# Patient Record
Sex: Female | Born: 1995 | Race: Black or African American | Hispanic: No | Marital: Single | Smoking: Never smoker
Health system: Southern US, Community
[De-identification: ages and names within clinical notes are randomized; demographics above are authoritative.]

## PROBLEM LIST (undated history)

## (undated) HISTORY — PX: OTHER SURGICAL HISTORY: SHX169

---
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
## 2017-09-22 ENCOUNTER — Encounter: Payer: Self-pay | Admitting: Emergency Medicine

## 2017-09-22 ENCOUNTER — Emergency Department: Payer: Medicaid Other

## 2017-09-22 ENCOUNTER — Emergency Department
Admission: EM | Admit: 2017-09-22 | Discharge: 2017-09-22 | Disposition: A | Discharge status: Home / Self Care | Payer: Medicaid Other | Attending: Emergency Medicine | Admitting: Emergency Medicine

## 2017-09-22 DIAGNOSIS — R0789 Other chest pain: Secondary | ICD-10-CM | POA: Insufficient documentation

## 2017-09-22 MED ORDER — TRAMADOL HCL 50 MG PO TABS: 50.0000 mg | ORAL_TABLET | Freq: Two times a day (BID) | ORAL | 0 refills | Status: DC | PRN

## 2017-09-22 MED ORDER — NAPROXEN 500 MG PO TABS
500.0000 mg | ORAL_TABLET | Freq: Once | ORAL | Status: AC
  Administered 2017-09-22: 500 mg via ORAL
  Filled 2017-09-22: qty 1

## 2017-09-22 MED ORDER — TRAMADOL HCL 50 MG PO TABS
50.0000 mg | ORAL_TABLET | Freq: Once | ORAL | Status: AC
  Administered 2017-09-22: 50 mg via ORAL
  Filled 2017-09-22: qty 1

## 2017-09-22 MED ORDER — NAPROXEN 500 MG PO TABS: 500.0000 mg | ORAL_TABLET | Freq: Two times a day (BID) | ORAL | 0 refills | Status: DC

## 2017-09-22 NOTE — ED Provider Notes (Signed)
[facility] Emergency Department Provider Note   ____________________________________________   First MD Initiated Contact with Patient 09/22/17 1148     (approximate)  I have reviewed the triage vital signs and the nursing notes.   HISTORY  Chief Complaint Chest Pain    HPI Olivia Huynh is a 22 y.o. female patient complain of center upper chest wall pain secondary to a twisting bending incident.  Patient states she was in the shower when her creatinine fell and she twisted to the right and bent over to pick it up from the sting.  Patient that she felt a sharp pain in her chest.  Patient denies shortness of breath.  Patient states the pain does not radiate from the center of her chest.  Patient rates the pain as a 5/10.  Patient described the pain as "sore".  No palliative measured for complaint.  History reviewed. No pertinent past medical history.  There are no active problems to display for this patient.   History reviewed. No pertinent surgical history.  Prior to Admission medications   Medication Sig Start Date End Date Taking? Authorizing Provider  naproxen (NAPROSYN) 500 MG tablet Take 1 tablet (500 mg total) by mouth 2 (two) times daily with a meal. 09/22/17   Joni Reining, PA-C  traMADol (ULTRAM) 50 MG tablet Take 1 tablet (50 mg total) by mouth every 12 (twelve) hours as needed. 09/22/17   Joni Reining, PA-C    Allergies Penicillins  No family history on file.  Social History Social History   Tobacco Use  . Smoking status: Never Smoker  . Smokeless tobacco: Never Used  Substance Use Topics  . Alcohol use: No    Frequency: Never  . Drug use: No    Review of Systems Constitutional: No fever/chills Eyes: No visual changes. ENT: No sore throat. Cardiovascular: Denies chest pain. Respiratory: Denies shortness of breath. Gastrointestinal: No abdominal pain.  No nausea, no vomiting.  No diarrhea.  No  constipation. Genitourinary: Negative for dysuria. Musculoskeletal: Anterior upper chest wall pain Skin: Negative for rash. Neurological: Negative for headaches, focal weakness or numbness. Allergic/Immunilogical: Penicillin ____________________________________________   PHYSICAL EXAM:  VITAL SIGNS: ED Triage Vitals  Enc Vitals Group     BP 09/22/17 1136 131/71     Pulse Rate 09/22/17 1130 73     Resp 09/22/17 1130 16     Temp 09/22/17 1130 98.7 F (37.1 C)     Temp Source 09/22/17 1130 Oral     SpO2 09/22/17 1130 100 %     Weight 09/22/17 1130 278 lb (126.1 kg)     Height 09/22/17 1130 5\' 4"  (1.626 m)     Head Circumference --      Peak Flow --      Pain Score 09/22/17 1130 5     Pain Loc --      Pain Edu? --      Excl. in GC? --    Constitutional: Alert and oriented. Well appearing and in no acute distress.  Morbid obesity Neck: No stridor.   Cardiovascular: Normal rate, regular rhythm. Grossly normal heart sounds.  Good peripheral circulation. Respiratory: Normal respiratory effort.  No retractions. Lungs CTAB. Musculoskeletal: No chest wall deformity.  No abrasion or ecchymosis.  Patient is moderate guarding with palpation central chest wall.  Patient has full and equal chest wall expansion with respiration. Neurologic:  Normal speech and language. No gross focal neurologic deficits are appreciated. No gait instability. Skin:  Skin is warm, dry and intact. No rash noted. Psychiatric: Mood and affect are normal. Speech and behavior are normal.  ____________________________________________   LABS (all labs ordered are listed, but only abnormal results are displayed)  Labs Reviewed  POC URINE PREG, ED   ____________________________________________  EKG Read by heart station Dr.  ____________________________________________  RADIOLOGY  Dg Chest 2 View  Result Date: 09/22/2017 CLINICAL DATA:  Left-sided chest pain and shortness of breath. Dizziness for 2  months. Symptoms are worse today. EXAM: CHEST  2 VIEW COMPARISON:  None. FINDINGS: The heart size and mediastinal contours are within normal limits. Both lungs are clear. The visualized skeletal structures are unremarkable. IMPRESSION: Negative two view chest x-ray Electronically Signed   By: Marin Robertshristopher  Mattern M.D.   On: 09/22/2017 12:00    ____________________________________________   PROCEDURES  Procedure(s) performed:   Procedures  Critical Care performed: No  ____________________________________________   INITIAL IMPRESSION / ASSESSMENT AND PLAN / ED COURSE  As part of my medical decision making, I reviewed the following data within the electronic MEDICAL RECORD NUMBER    Chest wall pain secondary to inflammation.  Discuss EKG and chest x-ray findings with patient.  Patient given discharge care instructions and advised take medication as needed.  Patient advised follow-up PCP if condition persists.      ____________________________________________   FINAL CLINICAL IMPRESSION(S) / ED DIAGNOSES  Final diagnoses:  Chest wall pain     ED Discharge Orders        Ordered    naproxen (NAPROSYN) 500 MG tablet  2 times daily with meals     09/22/17 1224    traMADol (ULTRAM) 50 MG tablet  Every 12 hours PRN     09/22/17 1224       Note:  This document was prepared using Dragon voice recognition software and may include unintentional dictation errors.    Joni ReiningSmith, Latayna Ritchie K, PA-C 09/22/17 1231    Rockne MenghiniNorman, Anne-Caroline, MD 09/22/17 [phone]

## 2017-09-22 NOTE — ED Notes (Signed)
See triage note  Presents with some discomfort in mid chest after picking a curtain rod  resp even and non labored

## 2017-09-22 NOTE — ED Triage Notes (Signed)
Pt to ED via POV with c/o CP this am that started after she picked up a shower rod. Pt denies any injury to chest with object. Pt ambulatory, A&OX4, VS stable

## 2019-02-16 IMAGING — CR DG CHEST 2V
1 series · 2 of 2 positions shown · non-contrast
Comparison: None.

CLINICAL DATA: Left-sided chest pain and shortness of breath.
Dizziness for 2 months. Symptoms are worse today.

EXAM:
CHEST  2 VIEW

[Series 1: w chest pa · 0.14mm/px · 2 of 2 slices shown]
[im 1/2]
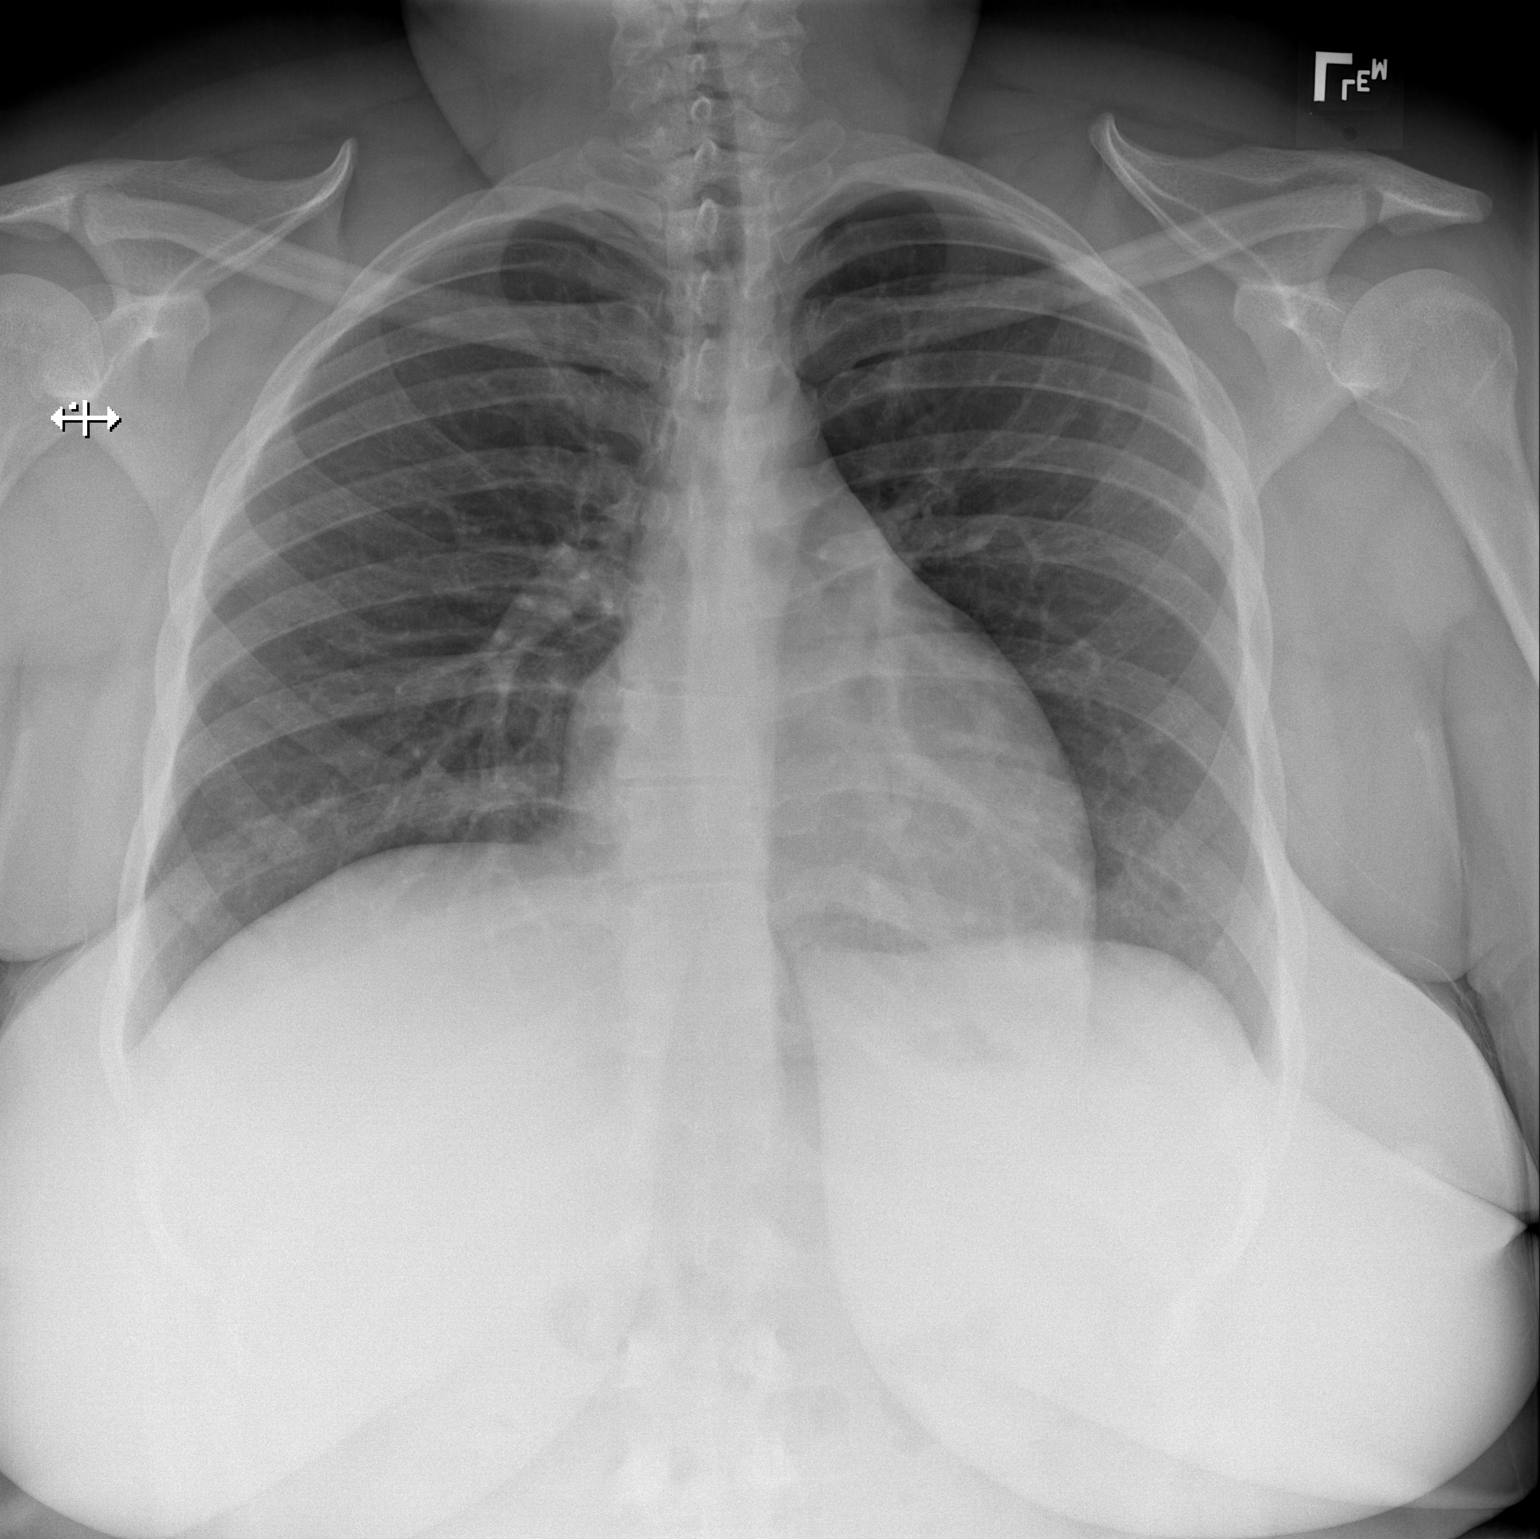
[im 2/2]
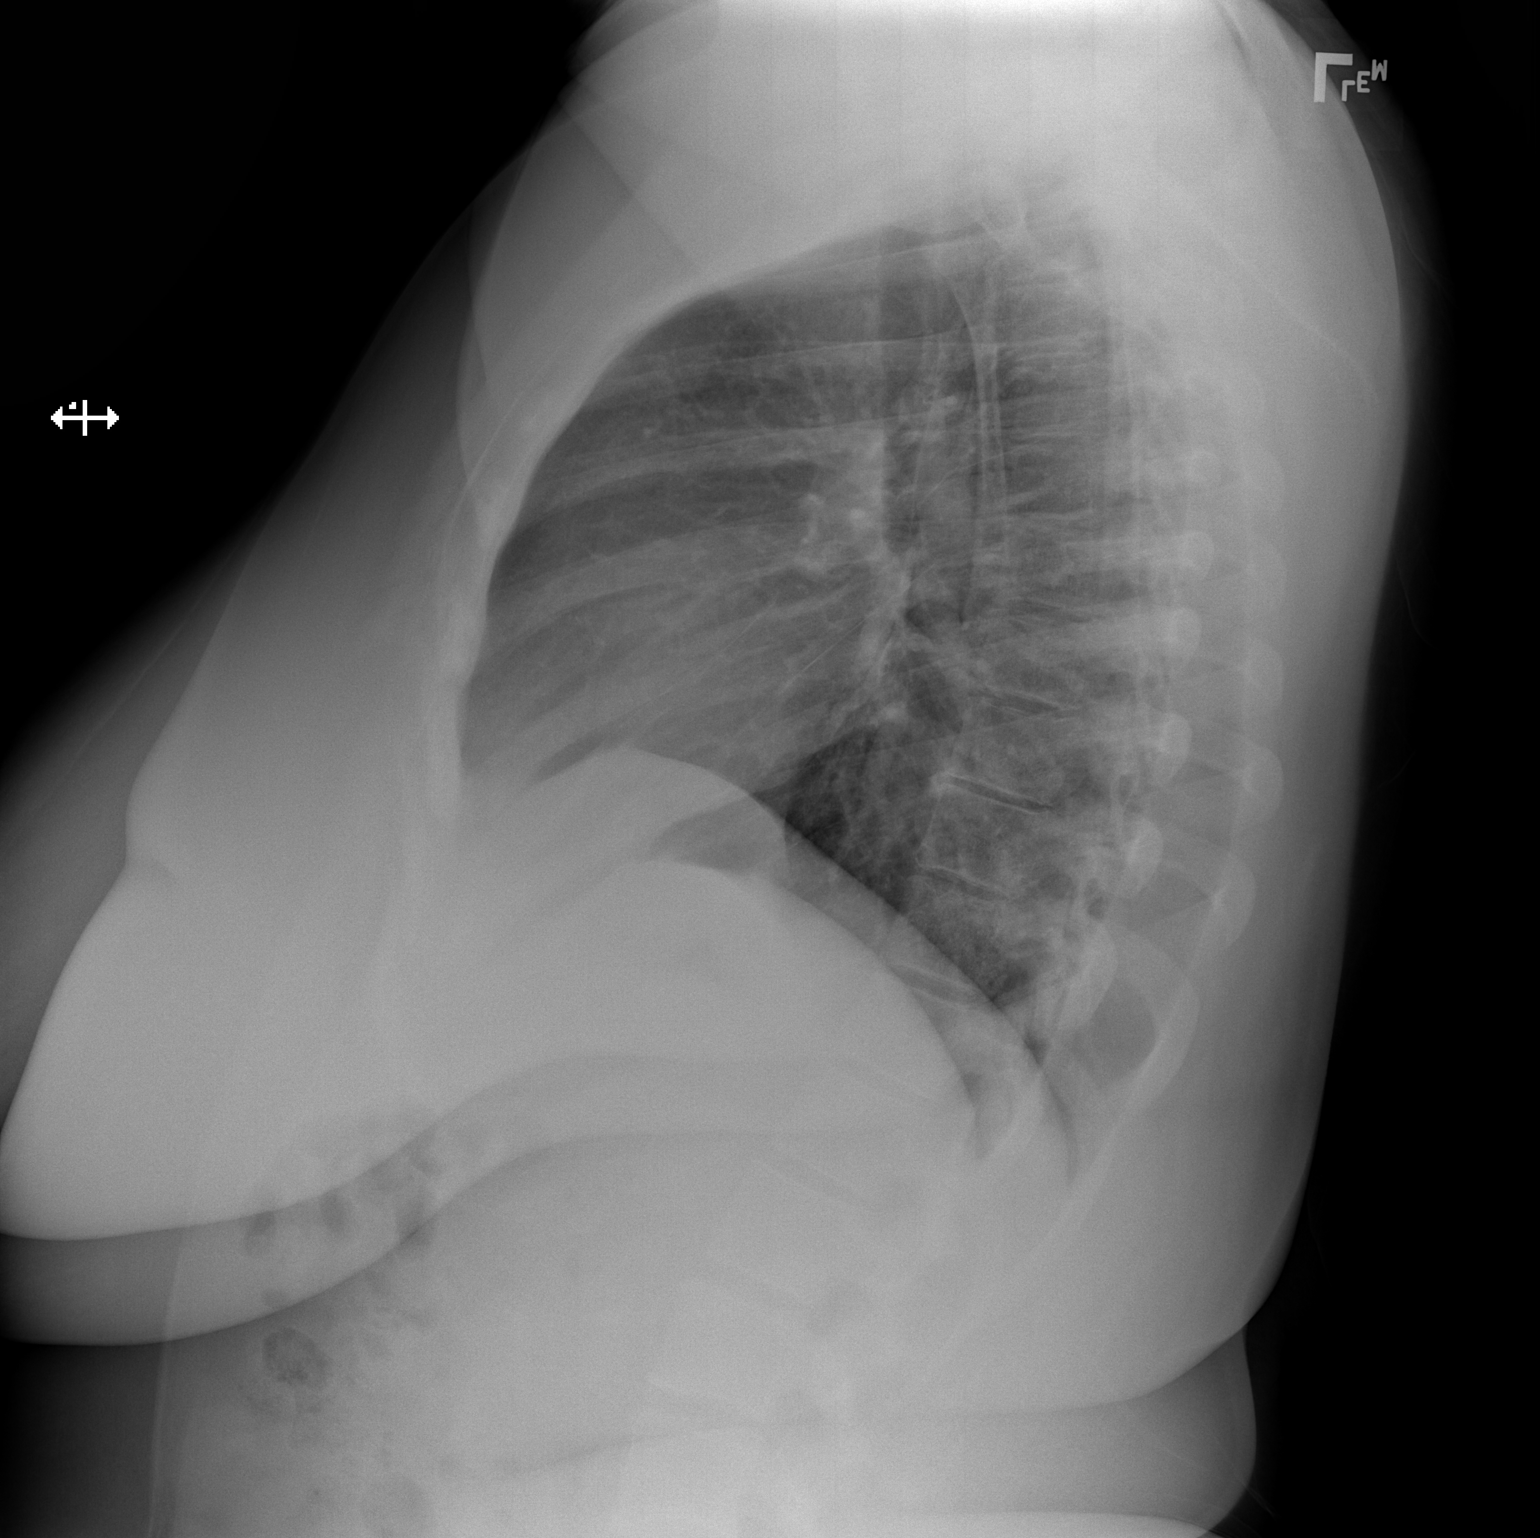

[2 of 2 positions shown; findings below may reference images not displayed]

FINDINGS: The heart size and mediastinal contours are within normal limits.
Both lungs are clear. The visualized skeletal structures are
unremarkable.
IMPRESSION: Negative two view chest x-ray

## 2023-07-24 ENCOUNTER — Emergency Department
Admission: EM | Admit: 2023-07-24 | Discharge: 2023-07-24 | Disposition: A | Discharge status: Home / Self Care | Payer: Medicaid Other | Attending: Emergency Medicine | Admitting: Emergency Medicine

## 2023-07-24 ENCOUNTER — Emergency Department: Payer: Medicaid Other

## 2023-07-24 DIAGNOSIS — R079 Chest pain, unspecified: Secondary | ICD-10-CM | POA: Diagnosis present

## 2023-07-24 DIAGNOSIS — M940 Chondrocostal junction syndrome [Tietze]: Secondary | ICD-10-CM | POA: Diagnosis not present

## 2023-07-24 LAB — BASIC METABOLIC PANEL
Anion gap: 10 (ref 5–15)
BUN: 8 mg/dL (ref 6–20)
CO2: 24 mmol/L (ref 22–32)
Calcium: 8.6 mg/dL — ABNORMAL LOW (ref 8.9–10.3)
Chloride: 103 mmol/L (ref 98–111)
Creatinine, Ser: 0.63 mg/dL (ref 0.44–1.00)
GFR, Estimated: >60 mL/min (ref >60)
Glucose, Bld: 93 mg/dL (ref 70–99)
Potassium: 3.5 mmol/L (ref 3.5–5.1)
Sodium: 137 mmol/L (ref 135–145)

## 2023-07-24 LAB — CBC
HCT: 35.8 % — ABNORMAL LOW (ref 36.0–46.0)
Hemoglobin: 11.6 g/dL — ABNORMAL LOW (ref 12.0–15.0)
MCH: 27.6 pg (ref 26.0–34.0)
MCHC: 32.4 g/dL (ref 30.0–36.0)
MCV: 85.2 fL (ref 80.0–100.0)
Platelets: 334 10*3/uL (ref 150–400)
RBC: 4.2 MIL/uL (ref 3.87–5.11)
RDW: 13.2 % (ref 11.5–15.5)
WBC: 8.4 10*3/uL (ref 4.0–10.5)
nRBC: 0 % (ref 0.0–0.2)

## 2023-07-24 LAB — TROPONIN I (HIGH SENSITIVITY): Troponin I (High Sensitivity): 2 ng/L (ref <18)

## 2023-07-24 MED ORDER — LIDOCAINE 5 % EX PTCH
1.0000 | MEDICATED_PATCH | CUTANEOUS | Status: DC
  Administered 2023-07-24: 1 via TRANSDERMAL
  Filled 2023-07-24: qty 1

## 2023-07-24 MED ORDER — LIDOCAINE 5 % EX PTCH: 1.0000 | MEDICATED_PATCH | Freq: Two times a day (BID) | CUTANEOUS | 0 refills | Status: AC

## 2023-07-24 MED ORDER — ACETAMINOPHEN 500 MG PO TABS
1000.0000 mg | ORAL_TABLET | Freq: Once | ORAL | Status: AC
  Administered 2023-07-24: 1000 mg via ORAL
  Filled 2023-07-24: qty 2

## 2023-07-24 MED ORDER — KETOROLAC TROMETHAMINE 30 MG/ML IJ SOLN
30.0000 mg | Freq: Once | INTRAMUSCULAR | Status: AC
  Administered 2023-07-24: 30 mg via INTRAMUSCULAR
  Filled 2023-07-24: qty 1

## 2023-07-24 MED ORDER — IBUPROFEN 600 MG PO TABS: 600.0000 mg | ORAL_TABLET | Freq: Four times a day (QID) | ORAL | 0 refills | Status: AC | PRN

## 2023-07-24 NOTE — ED Triage Notes (Signed)
Pt c/o L sided CP that started last night. Endorses SOB. Denies N/V, diaphoresis.

## 2023-07-24 NOTE — Discharge Instructions (Addendum)
Tylenol 1 g every 8 hours with the ibuprofen and a lidocaine patches.  Return to the ER if you develop shortness of breath that is worsening, swelling in 1 leg, coughing up blood or any other concerns

## 2023-07-24 NOTE — ED Provider Notes (Signed)
[facility] Provider Note    Event Date/Time   First MD Initiated Contact with Patient 07/24/23 2126     (approximate)   History   Chest Pain   HPI  Olivia Huynh is a 27 y.o. female who is otherwise healthy comes in with left-sided chest pain.  She reports pain started yesterday has been constant since yesterday.  She reports that occasionally when the pain is there she gets shortness of breath.  Pain is worse with movement.  Denies any shortness of breath at this point.  She reports history of costochondritis previously.  She denies any risk factors for PE.  Does report that it does hurt when she takes a deep breath but denies any fevers, coughing or other concerns.  She denies any smoking.  Physical Exam   Triage Vital Signs: ED Triage Vitals  Encounter Vitals Group     BP 07/24/23 1758 133/78     Systolic BP Percentile --      Diastolic BP Percentile --      Pulse Rate 07/24/23 1758 93     Resp 07/24/23 1758 17     Temp 07/24/23 1758 98.4 F (36.9 C)     Temp src --      SpO2 07/24/23 1758 98 %     Weight 07/24/23 1806 280 lb (127 kg)     Height 07/24/23 1806 5\' 4"  (1.626 m)     Head Circumference --      Peak Flow --      Pain Score 07/24/23 1806 8     Pain Loc --      Pain Education --      Exclude from Growth Chart --     Most recent vital signs: Vitals:   07/24/23 1758  BP: 133/78  Pulse: 93  Resp: 17  Temp: 98.4 F (36.9 C)  SpO2: 98%     General: Awake, no distress.  CV:  Good peripheral perfusion.  Resp:  Normal effort.  Abd:  No distention.  Other:  Some mild chest wall tenderness.  She does have some tenderness with twisting and movement No swelling in legs.  No calf tenderness  ED Results / Procedures / Treatments   Labs (all labs ordered are listed, but only abnormal results are displayed) Labs Reviewed  BASIC METABOLIC PANEL - Abnormal; Notable for the following components:      Result Value   Calcium 8.6  (*)    All other components within normal limits  CBC - Abnormal; Notable for the following components:   Hemoglobin 11.6 (*)    HCT 35.8 (*)    All other components within normal limits  POC URINE PREG, ED  TROPONIN I (HIGH SENSITIVITY)  TROPONIN I (HIGH SENSITIVITY)     EKG  My interpretation of EKG:  Normal sinus rate of 88 without any ST elevation or T wave inversions, normal intervals  RADIOLOGY I have reviewed the xray personally and interpreted no pneumonia  PROCEDURES:  Critical Care performed: No  Procedures   MEDICATIONS ORDERED IN ED: Medications - No data to display   IMPRESSION / MDM / ASSESSMENT AND PLAN / ED COURSE  I reviewed the triage vital signs and the nursing notes.   Patient's presentation is most consistent with acute presentation with potential threat to life or bodily function.   Differential is ACS, considered PE but patient is PERC negative.  Discussed with them D-dimer given her shortness of breath ebing pleuritic  but she denies any shortness of breath at this time and they would like to hold off.  I suspect that this is most likely musculoskeletal in nature.   BMP is reassuring.  CBC shows slightly low hemoglobin.  Troponin was negative.  Patient declined pregnancy testing and that she is not sexually active.  10:17 PM evaluated patient and she reports pain is gone away.  We discussed D-dimer again and she declined.  I think this is reasonable given she is PERC negative and pain is got better.  We discussed return precautions including shortness of breath that is worsening or any other concerns but at this time we will treat this like a costochondritis given it is worse with movement and with palpation.       FINAL CLINICAL IMPRESSION(S) / ED DIAGNOSES   Final diagnoses:  Costochondritis     Rx / DC Orders   ED Discharge Orders          Ordered    ibuprofen (ADVIL) 600 MG tablet  Every 6 hours PRN        07/24/23 2206     lidocaine (LIDODERM) 5 %  Every 12 hours        07/24/23 2206             Note:  This document was prepared using Dragon voice recognition software and may include unintentional dictation errors.   Concha Se, MD 07/24/23 2218

## 2024-01-14 ENCOUNTER — Encounter: Payer: Self-pay | Admitting: Student

## 2024-01-14 ENCOUNTER — Ambulatory Visit (INDEPENDENT_AMBULATORY_CARE_PROVIDER_SITE_OTHER): Admitting: Student

## 2024-01-14 VITALS — BP 118/74 | HR 70 | Ht 64.0 in | Wt 291.4 lb

## 2024-01-14 DIAGNOSIS — N926 Irregular menstruation, unspecified: Secondary | ICD-10-CM | POA: Insufficient documentation

## 2024-01-14 DIAGNOSIS — Z0000 Encounter for general adult medical examination without abnormal findings: Secondary | ICD-10-CM | POA: Insufficient documentation

## 2024-01-14 DIAGNOSIS — E66813 Obesity, class 3: Secondary | ICD-10-CM | POA: Insufficient documentation

## 2024-01-14 DIAGNOSIS — Z23 Encounter for immunization: Secondary | ICD-10-CM | POA: Diagnosis not present

## 2024-01-14 DIAGNOSIS — H18603 Keratoconus, unspecified, bilateral: Secondary | ICD-10-CM | POA: Diagnosis not present

## 2024-01-14 DIAGNOSIS — Z1159 Encounter for screening for other viral diseases: Secondary | ICD-10-CM | POA: Diagnosis not present

## 2024-01-14 DIAGNOSIS — Z6841 Body Mass Index (BMI) 40.0 and over, adult: Secondary | ICD-10-CM | POA: Insufficient documentation

## 2024-01-14 DIAGNOSIS — D509 Iron deficiency anemia, unspecified: Secondary | ICD-10-CM | POA: Insufficient documentation

## 2024-01-14 DIAGNOSIS — D500 Iron deficiency anemia secondary to blood loss (chronic): Secondary | ICD-10-CM | POA: Diagnosis not present

## 2024-01-14 DIAGNOSIS — R7303 Prediabetes: Secondary | ICD-10-CM | POA: Insufficient documentation

## 2024-01-14 DIAGNOSIS — H18609 Keratoconus, unspecified, unspecified eye: Secondary | ICD-10-CM | POA: Insufficient documentation

## 2024-01-14 NOTE — Assessment & Plan Note (Signed)
 S/p cross linking, still having blurred vision and issues with contacts. Has appointment next week [facility]

## 2024-01-14 NOTE — Assessment & Plan Note (Signed)
Referral made to OB/GYN 

## 2024-01-14 NOTE — Assessment & Plan Note (Signed)
 Likely due to prolong and irregular menses. Mild anemia on labs in 07/2023 with Hgb of 11.6. Check cbc, ferritin, and iron panel today.

## 2024-01-14 NOTE — Assessment & Plan Note (Signed)
 Reports history of prediabetes. Denies polydipsia or polyuria. Repeat A1c today.

## 2024-01-14 NOTE — Assessment & Plan Note (Addendum)
 BMI is 50.01 today. Discussed diet and exercise modifications. She made goal to get a new gym membership and go to the gym at least 1 time a week. She will make grocery list for mom on Fridays and limit eating out to 2 times a month. Discussed mediterranean diet and my plate. She declined nutrition therapy.

## 2024-01-14 NOTE — Progress Notes (Signed)
 New Patient Office Visit  Subjective    Patient ID: Olivia Huynh, female    DOB: [birth year]  Age: 28 y.o. MRN: [id number]  CC:  Chief Complaint  Patient presents with   Establish Care    wants to establish care, aged out of  peds, didn't go to anyone til now   Menstrual Problem    Irregular periods, lasting 1 month long    HPI Olivia Huynh [age] year old person with iron deficiency anemia, prediabetes, and keratoconus presents to establish care. [employer] student [employer], [employer], and robotics. Lives with mother and siblings and aunt. Does not have drivers license. Previously seeing pediatrics in [place]. Patient interviewed with mother present.   History of iron deficiency due to heavy periods, this has been an issue for about 3 years. Reports prolonged periods lasting 1-2 months, spotting and cramping for the first 3 days, then flow is heavier passing 3-4 clots daily. Does not have insignificant pain. LMP 3/17-4/19. She was prescribed iron by pediatrics, and now taking over the counter supplement daily.   Age of menarche 11-12 years, typically had periods lasting 1 week but this has gradually become longer and more irregular. She is not currently sexually active. Has never used any form of birth control. Denies abdominal pain, n/v/d, vaginal discharge, rash, or pruritus.  Obesity Reports weight fluctuates between 285-295 lbs. Used to go to gym regularly but stopped. Now sedentary although walks around campus between classes. Reports she eats a lot of fast food and [employer]. Has difficulty stick to diet and routine. Mom reports family has had nutrition counseling in the past but patient has never stuck to diet.    Outpatient Encounter Medications as of 01/14/2024  Medication Sig   Iron-Vitamin C 65-125 MG TABS Take 125 mg by mouth daily.   [DISCONTINUED] Ferrous Sulfate Dried (FERROUS SULFATE CR PO) Take by mouth. OTC   [DISCONTINUED] naproxen  (NAPROSYN ) 500 MG  tablet Take 1 tablet (500 mg total) by mouth 2 (two) times daily with a meal. (Patient not taking: Reported on 01/14/2024)   [DISCONTINUED] traMADol  (ULTRAM ) 50 MG tablet Take 1 tablet (50 mg total) by mouth every 12 (twelve) hours as needed. (Patient not taking: Reported on 01/14/2024)   No facility-administered encounter medications on file as of 01/14/2024.    History reviewed. No pertinent past medical history.  Past Surgical History:  Procedure Laterality Date   corneal crosslinking      History reviewed. No pertinent family history.  Social History   Socioeconomic History   Marital status: Single    Spouse name: Not on file   Number of children: Not on file   Years of education: Not on file   Highest education level: Not on file  Occupational History   Not on file  Tobacco Use   Smoking status: Never   Smokeless tobacco: Never  Substance and Sexual Activity   Alcohol use: No   Drug use: No   Sexual activity: Not on file  Other Topics Concern   Not on file  Social History Narrative   Not on file   Social Drivers of Health   Financial Resource Strain: Low Risk  (08/20/2023)   Received from [facility]   Overall Financial Resource Strain (CARDIA)    Difficulty of Paying Living Expenses: Not hard at all  Food Insecurity: No Food Insecurity (08/20/2023)   Received from [facility]   Hunger Vital Sign    Worried About Running Out  of Food in the Last Year: Never true    Ran Out of Food in the Last Year: Never true  Transportation Needs: No Transportation Needs (08/20/2023)   Received from [facility] Transportation    In the past 12 months, has lack of transportation kept you from medical appointments or from getting medications?: No    Lack of Transportation (Non-Medical): No  Physical Activity: Not on file  Stress: Not on file  Social Connections: Not on file  Intimate Partner Violence: Not on file     ROS Refer to HPI    Objective   BP 118/74   Pulse 70   Ht 5\' 4"  (1.626 m)   Wt 291 lb 6 oz (132.2 kg)   LMP 11/15/2023   SpO2 99%   BMI 50.01 kg/m   Physical Exam Constitutional:      Appearance: She is obese.  Eyes:     General: No scleral icterus.    Extraocular Movements: Extraocular movements intact.     Conjunctiva/sclera: Conjunctivae normal.     Pupils: Pupils are equal, round, and reactive to light.  Cardiovascular:     Rate and Rhythm: Normal rate and regular rhythm.     Pulses: Normal pulses.     Heart sounds: No murmur heard. Pulmonary:     Effort: Pulmonary effort is normal.     Breath sounds: No rhonchi or rales.  Abdominal:     General: Abdomen is flat. Bowel sounds are normal. There is no distension.     Palpations: Abdomen is soft.     Tenderness: There is no abdominal tenderness.  Musculoskeletal:        General: Normal range of motion.     Right lower leg: No edema.     Left lower leg: No edema.  Skin:    General: Skin is warm and dry.     Capillary Refill: Capillary refill takes less than 2 seconds.     Coloration: Skin is not jaundiced or pale.  Neurological:     General: No focal deficit present.     Mental Status: She is alert and oriented to person, place, and time.  Psychiatric:        Mood and Affect: Mood normal.        Behavior: Behavior normal.        01/14/2024   10:00 AM  Depression screen PHQ 2/9  Decreased Interest 0  Down, Depressed, Hopeless 0  PHQ - 2 Score 0  Altered sleeping 0  Tired, decreased energy 0  Change in appetite 0  Feeling bad or failure about yourself  0  Trouble concentrating 0  Moving slowly or fidgety/restless 0  Suicidal thoughts 0  PHQ-9 Score 0  Difficult doing work/chores Not difficult at all      01/14/2024   10:00 AM  GAD 7 : Generalized Anxiety Score  Nervous, Anxious, on Edge 0  Control/stop worrying 0  Worry too much - different things 0  Trouble relaxing 0  Restless 0  Easily  annoyed or irritable 1  Afraid - awful might happen 0  Total GAD 7 Score 1  Anxiety Difficulty Not difficult at all    Last CBC Lab Results  Component Value Date   WBC 8.4 07/24/2023   HGB 11.6 (L) 07/24/2023   HCT 35.8 (L) 07/24/2023   MCV 85.2 07/24/2023   MCH 27.6 07/24/2023   RDW 13.2 07/24/2023   PLT 334 07/24/2023  Last metabolic panel Lab Results  Component Value Date   GLUCOSE 93 07/24/2023   NA 137 07/24/2023   K 3.5 07/24/2023   CL 103 07/24/2023   CO2 24 07/24/2023   BUN 8 07/24/2023   CREATININE 0.63 07/24/2023   GFRNONAA >60 07/24/2023   CALCIUM 8.6 (L) 07/24/2023   ANIONGAP 10 07/24/2023    Assessment & Plan:  Annual physical exam Assessment & Plan: Tdap ordered today She is up to date on HPV Referral to GYN for menstrual irregularities, deferred pap today HIV and hepatitis C screening  Orders: -     Hemoglobin A1c -     Lipid panel -     Comprehensive metabolic panel with GFR -     TSH  Keratoconus of both eyes Assessment & Plan: S/p cross linking, still having blurred vision and issues with contacts. Has appointment next week [place] [facility]   Iron deficiency anemia due to chronic blood loss Assessment & Plan: Likely due to prolong and irregular menses. Mild anemia on labs in 07/2023 with Hgb of 11.6. Check cbc, ferritin, and iron panel today.   Orders: -     CBC -     Iron, TIBC and Ferritin Panel -     Ambulatory referral to Obstetrics / Gynecology  Screening for viral disease -     HIV Antibody (routine testing w rflx) -     Hepatitis C antibody  Irregular menstruation -     Ambulatory referral to Obstetrics / Gynecology  Immunization due -     Tdap vaccine greater than or equal to 7yo IM  Obesity, class 3 Assessment & Plan: BMI is 50.01 today. Discussed diet and exercise modifications. She made goal to get a new gym membership and go to the gym at least 1 time a week. She will make grocery list for mom on Fridays and limit  eating out to 2 times a month. Discussed mediterranean diet and my plate. She declined nutrition therapy.    BMI 50.0-59.9, adult [facility])  Prediabetes Assessment & Plan: Reports history of prediabetes. Denies polydipsia or polyuria. Repeat A1c today.     Return in about 3 months (around 04/15/2024) for weight.   Barnetta Liberty, MD

## 2024-01-14 NOTE — Addendum Note (Signed)
 Addended by: Barnetta Liberty on: 01/14/2024 03:27 PM   Modules accepted: Level of Service

## 2024-01-14 NOTE — Assessment & Plan Note (Addendum)
 Tdap ordered today She is up to date on HPV Referral to GYN for menstrual irregularities, deferred pap today HIV and hepatitis C screening

## 2024-01-15 LAB — IRON,TIBC AND FERRITIN PANEL
Ferritin: 29 ng/mL (ref 15–150)
Iron Saturation: 15 % (ref 15–55)
Iron: 54 ug/dL (ref 27–159)
Total Iron Binding Capacity: 372 ug/dL (ref 250–450)
UIBC: 318 ug/dL (ref 131–425)

## 2024-01-15 LAB — COMPREHENSIVE METABOLIC PANEL WITH GFR
ALT: 11 IU/L (ref 0–32)
AST: 16 IU/L (ref 0–40)
Albumin: 4.5 g/dL (ref 4.0–5.0)
Alkaline Phosphatase: 84 IU/L (ref 44–121)
BUN/Creatinine Ratio: 16 (ref 9–23)
BUN: 11 mg/dL (ref 6–20)
Bilirubin Total: 0.2 mg/dL (ref 0.0–1.2)
CO2: 22 mmol/L (ref 20–29)
Calcium: 9.3 mg/dL (ref 8.7–10.2)
Chloride: 102 mmol/L (ref 96–106)
Creatinine, Ser: 0.67 mg/dL (ref 0.57–1.00)
Globulin, Total: 3.2 g/dL (ref 1.5–4.5)
Glucose: 71 mg/dL (ref 70–99)
Potassium: 4.5 mmol/L (ref 3.5–5.2)
Sodium: 140 mmol/L (ref 134–144)
Total Protein: 7.7 g/dL (ref 6.0–8.5)
eGFR: 124 mL/min/{1.73_m2} (ref >59)

## 2024-01-15 LAB — CBC
Hematocrit: 34.3 % (ref 34.0–46.6)
Hemoglobin: 10.8 g/dL — ABNORMAL LOW (ref 11.1–15.9)
MCH: 28.1 pg (ref 26.6–33.0)
MCHC: 31.5 g/dL (ref 31.5–35.7)
MCV: 89 fL (ref 79–97)
Platelets: 360 10*3/uL (ref 150–450)
RBC: 3.85 x10E6/uL (ref 3.77–5.28)
RDW: 12.7 % (ref 11.7–15.4)
WBC: 8.2 10*3/uL (ref 3.4–10.8)

## 2024-01-15 LAB — LIPID PANEL
Chol/HDL Ratio: 2.2 ratio (ref 0.0–4.4)
Cholesterol, Total: 153 mg/dL (ref 100–199)
HDL: 71 mg/dL (ref >39)
LDL Chol Calc (NIH): 72 mg/dL (ref 0–99)
Triglycerides: 43 mg/dL (ref 0–149)
VLDL Cholesterol Cal: 10 mg/dL (ref 5–40)

## 2024-01-15 LAB — HEPATITIS C ANTIBODY: Hep C Virus Ab: NONREACTIVE

## 2024-01-15 LAB — HEMOGLOBIN A1C
Est. average glucose Bld gHb Est-mCnc: 97 mg/dL
Hgb A1c MFr Bld: 5 % (ref 4.8–5.6)

## 2024-01-15 LAB — TSH: TSH: 1.76 u[IU]/mL (ref 0.450–4.500)

## 2024-01-15 LAB — HIV ANTIBODY (ROUTINE TESTING W REFLEX): HIV Screen 4th Generation wRfx: NONREACTIVE

## 2024-01-20 ENCOUNTER — Ambulatory Visit: Payer: Self-pay | Admitting: Student

## 2024-01-20 NOTE — Telephone Encounter (Signed)
Please review patients response.

## 2024-01-21 MED ORDER — IRON (FERROUS SULFATE) 325 (65 FE) MG PO TABS: 325.0000 mg | ORAL_TABLET | Freq: Every day | ORAL | 3 refills | Status: AC

## 2024-01-21 NOTE — Telephone Encounter (Signed)
 Please review.  KP

## 2024-02-02 ENCOUNTER — Encounter: Payer: Self-pay | Admitting: Advanced Practice Midwife

## 2024-02-02 ENCOUNTER — Ambulatory Visit (INDEPENDENT_AMBULATORY_CARE_PROVIDER_SITE_OTHER): Admitting: Advanced Practice Midwife

## 2024-02-02 VITALS — BP 114/79 | HR 70 | Ht 64.0 in | Wt 294.5 lb

## 2024-02-02 DIAGNOSIS — N921 Excessive and frequent menstruation with irregular cycle: Secondary | ICD-10-CM

## 2024-02-02 MED ORDER — NORGESTIMATE-ETH ESTRADIOL 0.25-35 MG-MCG PO TABS: 1.0000 | ORAL_TABLET | Freq: Every day | ORAL | 11 refills | Status: AC

## 2024-02-02 NOTE — Progress Notes (Signed)
 Patient ID: Izela Altier, female   DOB: [birth year], 28 y.o.   MRN: [id number]  Reason for Consult: Menstrual Problem (Irregular periods lasting 1-2 months at a time)   Referred by Barnetta Liberty, MD  Subjective:  HPI:  Lorece Keach is a 28 y.o. female being seen by referral for irregular menstrual cycles. She is uncertain how long cycles have been irregular- possibly the last 8 years as she remembers telling this to the pediatrician. Menses occur every 1-3 months and can last for 2 weeks up to 2.5 months. Bleeding is heavy per her report changing a pad every 1-2 hours and includes clots. She reports monthly periods from age [age] lasting 7 days, less heavy than they are now.   She has never been sexually active, no concern for STDs, has never had a PAP smear, did receive Gardasil vaccinations. She has never taken hormones for birth/cycle control. Has been seen by PCP who is encouraging lifestyle changes- diet/exercise. Normal Hgb A1C at recent labs.  We discussed recommendation and rationale for PAP (cervical cancer screening) beginning at age 89. She is not ready mentally/emotionally for that today and will schedule at a later time. We discussed hormone function that can be affected by excess weight causing irregular periods and the possibility of PCOS that is associated with irregular cycles, obesity, infertility. Loss of 15% body weight can be associated with improved hormone function/fertility. She would like to have PCOS lab done today.  History reviewed. No pertinent past medical history. History reviewed. No pertinent family history. Past Surgical History:  Procedure Laterality Date   corneal crosslinking      Short Social History:  Social History   Tobacco Use   Smoking status: Never   Smokeless tobacco: Never  Substance Use Topics   Alcohol use: No    Allergies  Allergen Reactions   Nickel Hives   Penicillins     amoxocillin gives hives     Current Outpatient  Medications  Medication Sig Dispense Refill   Iron , Ferrous Sulfate , 325 (65 Fe) MG TABS Take 325 mg by mouth daily. 30 tablet 3   norgestimate-ethinyl estradiol (ORTHO-CYCLEN) 0.25-35 MG-MCG tablet Take 1 tablet by mouth daily. 30 tablet 11   No current facility-administered medications for this visit.    Review of Systems  Constitutional:  Negative for chills and fever.  HENT:  Negative for congestion, ear discharge, ear pain, hearing loss, sinus pain and sore throat.   Eyes:  Negative for blurred vision and double vision.  Respiratory:  Negative for cough, shortness of breath and wheezing.   Cardiovascular:  Negative for chest pain, palpitations and leg swelling.  Gastrointestinal:  Negative for abdominal pain, blood in stool, constipation, diarrhea, heartburn, melena, nausea and vomiting.  Genitourinary:  Negative for dysuria, flank pain, frequency, hematuria and urgency.  Musculoskeletal:  Negative for back pain, joint pain and myalgias.  Skin:  Negative for itching and rash.  Neurological:  Negative for dizziness, tingling, tremors, sensory change, speech change, focal weakness, seizures, loss of consciousness, weakness and headaches.  Endo/Heme/Allergies:  Negative for environmental allergies. Does not bruise/bleed easily.       Positive for irregular menstrual cycles  Psychiatric/Behavioral:  Negative for depression, hallucinations, memory loss, substance abuse and suicidal ideas. The patient is not nervous/anxious and does not have insomnia.        Objective:  Objective   Vitals:   02/02/24 0818  BP: 114/79  Pulse: 70  Weight: 294 lb 8 oz (133.6 kg)  Height: 5\' 4"  (1.626 m)   Body mass index is 50.55 kg/m. Constitutional: obese female in no acute distress.  HEENT: normal Skin: Warm and dry.  Cardiovascular: Regular rate and rhythm.   Respiratory:  Normal respiratory effort Psych: Alert and Oriented x3. No memory deficits. Normal mood and affect.      Assessment/Plan:     27 y.o. G0 P0 female with irregular, heavy/prolonged menstrual cycles  PCOS panel Healthy lifestyle; diet, exercise Rx Sprintec for cycle regulation Follow up as needed Schedule PAP smear when ready   Angelita Kendall, CNM [facility] 02/02/2024 9:51 AM

## 2024-02-02 NOTE — Patient Instructions (Signed)
 Why Obesity Matters Obesity is a condition common in both adults and children that can significantly impact overall health. You will learn how this medical condition develops, and the impact on your health. To view the content, go to this web address: [email or web address]  This video will expire on: 08/11/2025. If you need access to this video following this date, please reach out to the healthcare provider who assigned it to you. This information is not intended to replace advice given to you by your health care provider. Make sure you discuss any questions you have with your health care provider. Elsevier Patient Education  2024 Elsevier Inc.Obesity, Adult Obesity is having too much body fat. Being obese means that your weight is more than what is healthy for you.  BMI (body mass index) is a number that explains how much body fat you have. If you have a BMI of 30 or more, you are obese. Obesity can cause serious health problems, such as: Stroke. Coronary artery disease (CAD). Type 2 diabetes. Some types of cancer. High blood pressure (hypertension). High cholesterol. Gallbladder stones. Obesity can also contribute to: Osteoarthritis. Sleep apnea. Infertility problems. What are the causes? Eating meals each day that are high in calories, sugar, and fat. Drinking a lot of drinks that have sugar in them. Being born with genes that may make you more likely to become obese. Having a medical condition that causes obesity. Taking certain medicines. Sitting a lot (having a sedentary lifestyle). Not getting enough sleep. What increases the risk? Having a family history of obesity. Living in an area with limited access to: [place], recreation centers, or sidewalks. Healthy food choices, such as grocery stores and farmers' markets. What are the signs or symptoms? The main sign is having too much body fat. How is this treated? Treatment for this condition often includes  changing your lifestyle. Treatment may include: Changing your diet. This may include making a healthy meal plan. Exercise. This may include activity that causes your heart to beat faster (aerobic exercise) and strength training. Work with your doctor to design a program that works for you. Medicine to help you lose weight. This may be used if you are not able to lose one pound a week after 6 weeks of healthy eating and more exercise. Treating conditions that cause the obesity. Surgery. Options may include gastric banding and gastric bypass. This may be done if: Other treatments have not helped to improve your condition. You have a BMI of 40 or higher. You have life-threatening health problems related to obesity. Follow these instructions at home: Eating and drinking  Follow advice from your doctor about what to eat and drink. Your doctor may tell you to: Limit fast food, sweets, and processed snack foods. Choose low-fat options. For example, choose low-fat milk instead of whole milk. Eat five or more servings of fruits or vegetables each day. Eat at home more often. This gives you more control over what you eat. Choose healthy foods when you eat out. Learn to read food labels. This will help you learn how much food is in one serving. Keep low-fat snacks available. Avoid drinks that have a lot of sugar in them. These include soda, fruit juice, iced tea with sugar, and flavored milk. Drink enough water to keep your pee (urine) pale yellow. Do not go on fad diets. Physical activity Exercise often, as told by your doctor. Most adults should get up to 150 minutes of moderate-intensity exercise every week.Ask your doctor: What types of  exercise are safe for you. How often you should exercise. Warm up and stretch before being active. Do slow stretching after being active (cool down). Rest between times of being active. Lifestyle Work with your doctor and a food expert (dietitian) to set a  weight-loss goal that is best for you. Limit your screen time. Find ways to reward yourself that do not involve food. Do not drink alcohol if: Your doctor tells you not to drink. You are pregnant, may be pregnant, or are planning to become pregnant. If you drink alcohol: Limit how much you have to: 0-1 drink a day for women. 0-2 drinks a day for men. Know how much alcohol is in your drink. In the [place] one drink equals one 12 oz bottle of beer (355 mL), one 5 oz glass of wine (148 mL), or one 1 oz glass of hard liquor (44 mL). General instructions Keep a weight-loss journal. This can help you keep track of: The food that you eat. How much exercise you get. Take over-the-counter and prescription medicines only as told by your doctor. Take vitamins and supplements only as told by your doctor. Think about joining a support group. Pay attention to your mental health as obesity can lead to depression or self esteem issues. Keep all follow-up visits. Contact a doctor if: You cannot meet your weight-loss goal after you have changed your diet and lifestyle for 6 weeks. You are having trouble breathing. Summary Obesity is having too much body fat. Being obese means that your weight is more than what is healthy for you. Work with your doctor to set a weight-loss goal. Get regular exercise as told by your doctor. This information is not intended to replace advice given to you by your health care provider. Make sure you discuss any questions you have with your health care provider. Document Revised: 03/25/2021 Document Reviewed: 03/25/2021 Elsevier Patient Education  2024 Elsevier Inc.Mediterranean Diet A Mediterranean diet is based on the traditions of countries on the [employer]. It focuses on eating more: Fruits and vegetables. Whole grains, beans, nuts, and seeds. Heart-healthy fats. These are fats that are good for your heart. It involves eating less: Dairy. Meat and  eggs. Processed foods with added sugar, salt, and fat. This type of diet can help prevent certain conditions. It can also improve outcomes if you have a long-term (chronic) disease, such as kidney or heart disease. What are tips for following this plan? Reading food labels Check packaged foods for: The serving size. For foods such as rice and pasta, the serving size is the amount of cooked product, not dry. The total fat. Avoid foods with saturated fat or trans fat. Added sugars, such as corn syrup. Shopping  Try to have a balanced diet. Buy a variety of foods, such as: Fresh fruits and vegetables. You may be able to get these from local farmers markets. You can also buy them frozen. Grains, beans, nuts, and seeds. Some of these can be bought in bulk. Fresh seafood. Poultry and eggs. Low-fat dairy products. Buy whole ingredients instead of foods that have already been packaged. If you can't get fresh seafood, buy precooked frozen shrimp or canned fish, such as tuna, salmon, or sardines. Stock your pantry so you always have certain foods on hand, such as olive oil, canned tuna, canned tomatoes, rice, pasta, and beans. Cooking Cook foods with extra-virgin olive oil instead of using butter or other vegetable oils. Have meat as a side dish. Have vegetables or grains  as your main dish. This means having meat in small portions or adding small amounts of meat to foods like pasta or stew. Use beans or vegetables instead of meat in common dishes like chili or lasagna. Try out different cooking methods. Try roasting, broiling, steaming, and sauting vegetables. Add frozen vegetables to soups, stews, pasta, or rice. Add nuts or seeds for added healthy fats and plant protein at each meal. You can add these to yogurt, salads, or vegetable dishes. Marinate fish or vegetables using olive oil, lemon juice, garlic, and fresh herbs. Meal planning Plan to eat a vegetarian meal one day each week. Try to  work up to two vegetarian meals, if possible. Eat seafood two or more times a week. Have healthy snacks on hand. These may include: Vegetable sticks with hummus. Greek yogurt. Fruit and nut trail mix. Eat balanced meals. These should include: Fruit: 2-3 servings a day. Vegetables: 4-5 servings a day. Low-fat dairy: 2 servings a day. Fish, poultry, or lean meat: 1 serving a day. Beans and legumes: 2 or more servings a week. Nuts and seeds: 1-2 servings a day. Whole grains: 6-8 servings a day. Extra-virgin olive oil: 3-4 servings a day. Limit red meat and sweets to just a few servings a month. Lifestyle  Try to cook and eat meals with your family. Drink enough fluid to keep your pee (urine) pale yellow. Be active every day. This includes: Aerobic exercise, which is exercise that causes your heart to beat faster. Examples include running and swimming. Leisure activities like gardening, walking, or housework. Get 7-8 hours of sleep each night. Drink red wine if your provider says you can. A glass of wine is 5 oz (150 mL). You may be allowed to have: Up to 1 glass a day if you're female and not pregnant. Up to 2 glasses a day if you're female. What foods should I eat? Fruits Apples. Apricots. Avocado. Berries. Bananas. Cherries. Dates. Figs. Grapes. Lemons. Melon. Oranges. Peaches. Plums. Pomegranate. Vegetables Artichokes. Beets. Broccoli. Cabbage. Carrots. Eggplant. Green beans. Chard. Kale. Spinach. Onions. Leeks. Peas. Squash. Tomatoes. Peppers. Radishes. Grains Whole-grain pasta. Brown rice. Bulgur wheat. Polenta. Couscous. Whole-wheat bread. Dwyane Glad. Meats and other proteins Beans. Almonds. Sunflower seeds. Pine nuts. Peanuts. Cod. Salmon. Scallops. Shrimp. Tuna. Tilapia. Clams. Oysters. Eggs. Chicken or [place] without skin. Dairy Low-fat milk. Cheese. Greek yogurt. Fats and oils Extra-virgin olive oil. Avocado oil. Grapeseed oil. Beverages Water. Red wine. Herbal  tea. Sweets and desserts Greek yogurt with honey. Baked apples. Poached pears. Trail mix. Seasonings and condiments Basil. Cilantro. Coriander. Cumin. Mint. Parsley. Sage. Rosemary. Tarragon. Garlic. Oregano. Thyme. Pepper. Balsamic vinegar. Tahini. Hummus. Tomato sauce. Olives. Mushrooms. The items listed above may not be all the foods and drinks you can have. Talk to a dietitian to learn more. What foods should I limit? This is a list of foods that should be eaten rarely. Fruits Fruit canned in syrup. Vegetables Deep-fried potatoes, like [place] fries. Grains Packaged pasta or rice dishes. Cereal with added sugar. Snacks with added sugar. Meats and other proteins Beef. Pork. Lamb. Chicken or [place] with skin. Hot dogs. Helene Loader. Dairy Ice cream. Sour cream. Whole milk. Fats and oils Butter. Canola oil. Vegetable oil. Beef fat (tallow). Lard. Beverages Juice. Sugar-sweetened soft drinks. Beer. Liquor and spirits. Sweets and desserts Cookies. Cakes. Pies. Candy. Seasonings and condiments Mayonnaise. Pre-made sauces and marinades. The items listed above may not be all the foods and drinks you should limit. Talk to a dietitian to learn  more. Where to find more information American Heart Association (AHA): heart.org This information is not intended to replace advice given to you by your health care provider. Make sure you discuss any questions you have with your health care provider. Document Revised: 11/29/2022 Document Reviewed: 11/29/2022 Elsevier Patient Education  2024 [employer].

## 2024-02-06 LAB — TSH+PRL+FSH+TESTT+LH+DHEA S...
17-Hydroxyprogesterone: 17 ng/dL
Androstenedione: 87 ng/dL (ref 41–262)
DHEA-SO4: 97.5 ug/dL (ref 84.8–378.0)
FSH: 2.7 m[IU]/mL
LH: 8.9 m[IU]/mL
Prolactin: 10.8 ng/mL (ref 4.8–33.4)
TSH: 1.28 u[IU]/mL (ref 0.450–4.500)
Testosterone, Free: 0.7 pg/mL (ref 0.0–4.2)
Testosterone: 16 ng/dL (ref 13–71)

## 2024-02-27 ENCOUNTER — Ambulatory Visit: Payer: Self-pay | Admitting: Advanced Practice Midwife

## 2024-04-17 ENCOUNTER — Encounter: Payer: Self-pay | Admitting: Student

## 2024-04-17 ENCOUNTER — Ambulatory Visit (INDEPENDENT_AMBULATORY_CARE_PROVIDER_SITE_OTHER): Admitting: Student

## 2024-04-17 VITALS — BP 114/74 | HR 88 | Ht 64.0 in | Wt 289.0 lb

## 2024-04-17 DIAGNOSIS — D500 Iron deficiency anemia secondary to blood loss (chronic): Secondary | ICD-10-CM | POA: Diagnosis not present

## 2024-04-17 DIAGNOSIS — N926 Irregular menstruation, unspecified: Secondary | ICD-10-CM | POA: Diagnosis not present

## 2024-04-17 DIAGNOSIS — E66813 Obesity, class 3: Secondary | ICD-10-CM | POA: Diagnosis not present

## 2024-04-17 NOTE — Assessment & Plan Note (Addendum)
 Tolerating iron  supplements without significant constipation. CBC today. Continue iron  supplementation.

## 2024-04-17 NOTE — Progress Notes (Signed)
 Established Patient Office Visit  Subjective   Patient ID: Olivia Huynh, female    DOB: [birth year]  Age: 28 y.o. MRN: [id number]  Chief Complaint  Patient presents with   Weight Check    Olivia Huynh with medical hx listed below presents today for follow of IDA and obesity. Is feeling well today. Has been working on limiting overeating. Has also started iron  supplements, was on COC for irregular menses but stopped due to prolonged bleeding while on medication.    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   norgestimate -ethinyl estradiol  (ORTHO-CYCLEN) 0.25-35 MG-MCG tablet Take 1 tablet by mouth daily. (Patient not taking: Reported on 04/17/2024)   [DISCONTINUED] erythromycin ophthalmic ointment Place 1 Application into both eyes once. (Patient not taking: Reported on 04/17/2024)   [DISCONTINUED] Iron -Vitamin C 65-125 MG TABS Take 125 mg by mouth daily.   No facility-administered encounter medications on file as of 04/17/2024.    BP 114/74   Pulse 88   Ht 5' 4 (1.626 m)   Wt 289 lb (131.1 kg)   LMP 04/12/2024 (Approximate)   SpO2 97%   BMI 49.61 kg/m  BP Readings from Last 3 Encounters:  04/17/24 114/74  02/02/24 114/79  01/14/24 118/74    Physical Exam Constitutional:      Appearance: She is obese.  HENT:     Head: Normocephalic and atraumatic.  Eyes:     Extraocular Movements: Extraocular movements intact.     Conjunctiva/sclera: Conjunctivae normal.     Pupils: Pupils are equal, round, and reactive to light.  Cardiovascular:     Rate and Rhythm: Normal rate and regular rhythm.  Pulmonary:      Effort: Pulmonary effort is normal.     Breath sounds: No rhonchi or rales.  Abdominal:     General: Abdomen is flat. Bowel sounds are normal. There is no distension.     Palpations: Abdomen is soft.     Tenderness: There is no abdominal tenderness.  Musculoskeletal:        General: Normal range of motion.     Right lower leg: No edema.     Left lower leg: No edema.  Skin:    General: Skin is warm and dry.     Capillary Refill: Capillary refill takes less than 2 seconds.  Neurological:     General: No focal deficit present.     Mental Status: She is alert and oriented to person, place, and time.  Psychiatric:        Mood and Affect: Mood normal.        Behavior: Behavior normal.        04/17/2024   10:32 AM 01/14/2024   10:00 AM  Depression screen PHQ 2/9  Decreased Interest 0 0  Down, Depressed, Hopeless 1 0  PHQ - 2 Score 1 0  Altered sleeping 2 0  Tired, decreased energy 1 0  Change in appetite 0 0  Feeling bad or failure about yourself  1 0  Trouble concentrating 1 0  Moving slowly or fidgety/restless 0 0  Suicidal thoughts  0 0  PHQ-9 Score 6 0  Difficult doing work/chores Somewhat difficult Not difficult at all       04/17/2024   10:32 AM 01/14/2024   10:00 AM  GAD 7 : Generalized Anxiety Score  Nervous, Anxious, on Edge 1 0  Control/stop worrying 0 0  Worry too much - different things 0 0  Trouble relaxing 2 0  Restless 1 0  Easily annoyed or irritable 2 1  Afraid - awful might happen 1 0  Total GAD 7 Score 7 1  Anxiety Difficulty Not difficult at all Not difficult at all    No results found for any visits on 04/17/24.  Last CBC Lab Results  Component Value Date   WBC 8.2 01/14/2024   HGB 10.8 (L) 01/14/2024   HCT 34.3 01/14/2024   MCV 89 01/14/2024   MCH 28.1 01/14/2024   RDW 12.7 01/14/2024   PLT 360 01/14/2024   Last metabolic panel Lab Results  Component Value Date   GLUCOSE 71 01/14/2024   NA 140 01/14/2024   K 4.5 01/14/2024   CL 102  01/14/2024   CO2 22 01/14/2024   BUN 11 01/14/2024   CREATININE 0.67 01/14/2024   EGFR 124 01/14/2024   CALCIUM 9.3 01/14/2024   PROT 7.7 01/14/2024   ALBUMIN 4.5 01/14/2024   LABGLOB 3.2 01/14/2024   BILITOT 0.2 01/14/2024   ALKPHOS 84 01/14/2024   AST 16 01/14/2024   ALT 11 01/14/2024   ANIONGAP 10 07/24/2023   Last lipids Lab Results  Component Value Date   CHOL 153 01/14/2024   HDL 71 01/14/2024   LDLCALC 72 01/14/2024   TRIG 43 01/14/2024   CHOLHDL 2.2 01/14/2024      The ASCVD Risk score (Arnett DK, et al., 2019) failed to calculate for the following reasons:   The 2019 ASCVD risk score is only valid for ages 53 to 26    Assessment & Plan:  Iron  deficiency anemia due to chronic blood loss Assessment & Plan: Tolerating iron  supplements without significant constipation. CBC today. Continue iron  supplementation.  Orders: -     CBC with Differential/Platelet  Menstrual irregularity Assessment & Plan: Reports taking COC for about 1 month and reports constants cramping and heavy bleeding with this. Feels periods are dong a little better than prior since stopped this in the end of June/ early July. Discussed follow up with gyn if still having heavy periods.    Obesity, class 3 Assessment & Plan: Has cut back on fast food and overeating, and stopped eating when she is full. Was walking regularly but not recently due to weather. Does have gym equipment at home but does not like working our alone, she used to work out with brother. She made goal of working on and perhaps having brother join to help with motivation.   Orders: -     Referral to Nutrition and Diabetes Services     Return in about 6 months (around 10/18/2024) for iron  deficiency, obesity .    Olivia Saddler, MD

## 2024-04-17 NOTE — Assessment & Plan Note (Addendum)
 Has cut back on fast food and overeating, and stopped eating when she is full. Was walking regularly but not recently due to weather. Does have gym equipment at home but does not like working our alone, she used to work out with brother. She made goal of working on and perhaps having brother join to help with motivation.

## 2024-04-17 NOTE — Assessment & Plan Note (Addendum)
 Reports taking COC for about 1 month and reports constants cramping and heavy bleeding with this. Feels periods are dong a little better than prior since stopped this in the end of June/ early July. Discussed follow up with gyn if still having heavy periods.

## 2024-04-18 ENCOUNTER — Ambulatory Visit: Payer: Self-pay | Admitting: Student

## 2024-04-18 ENCOUNTER — Other Ambulatory Visit: Payer: Self-pay

## 2024-04-18 DIAGNOSIS — D649 Anemia, unspecified: Secondary | ICD-10-CM

## 2024-04-18 LAB — CBC WITH DIFFERENTIAL/PLATELET
Basophils Absolute: 0 x10E3/uL (ref 0.0–0.2)
Basos: 0 %
EOS (ABSOLUTE): 0.1 x10E3/uL (ref 0.0–0.4)
Eos: 1 %
Hematocrit: 31.1 % — ABNORMAL LOW (ref 34.0–46.6)
Hemoglobin: 9.6 g/dL — ABNORMAL LOW (ref 11.1–15.9)
Immature Grans (Abs): 0 x10E3/uL (ref 0.0–0.1)
Immature Granulocytes: 0 %
Lymphocytes Absolute: 2.3 x10E3/uL (ref 0.7–3.1)
Lymphs: 31 %
MCH: 25 pg — ABNORMAL LOW (ref 26.6–33.0)
MCHC: 30.9 g/dL — ABNORMAL LOW (ref 31.5–35.7)
MCV: 81 fL (ref 79–97)
Monocytes Absolute: 0.4 x10E3/uL (ref 0.1–0.9)
Monocytes: 5 %
Neutrophils Absolute: 4.5 x10E3/uL (ref 1.4–7.0)
Neutrophils: 62 %
Platelets: 402 x10E3/uL (ref 150–450)
RBC: 3.84 x10E6/uL (ref 3.77–5.28)
RDW: 14.6 % (ref 11.7–15.4)
WBC: 7.4 x10E3/uL (ref 3.4–10.8)

## 2024-05-02 NOTE — Telephone Encounter (Signed)
 Called pt gave her the number to call she will call to schedule an appointment.  KP

## 2024-10-25 ENCOUNTER — Ambulatory Visit: Admitting: Student
